# Patient Record
Sex: Male | Born: 1940 | Race: White | Hispanic: No | Marital: Married
Health system: Southern US, Community
[De-identification: ages and names within clinical notes are randomized; demographics above are authoritative.]

---

## 1998-01-01 ENCOUNTER — Encounter: Payer: Self-pay | Admitting: Emergency Medicine

## 1998-01-01 ENCOUNTER — Observation Stay (HOSPITAL_COMMUNITY): Admission: EM | Admit: 1998-01-01 | Discharge: 1998-01-02 | Payer: Self-pay | Admitting: Emergency Medicine

## 1998-01-02 ENCOUNTER — Ambulatory Visit (HOSPITAL_COMMUNITY): Admission: RE | Admit: 1998-01-02 | Discharge: 1998-01-02 | Payer: Self-pay | Admitting: Cardiovascular Disease

## 2003-02-17 ENCOUNTER — Other Ambulatory Visit: Payer: Self-pay

## 2003-02-18 ENCOUNTER — Other Ambulatory Visit: Payer: Self-pay

## 2004-06-09 ENCOUNTER — Emergency Department: Payer: Self-pay | Admitting: Emergency Medicine

## 2005-07-27 ENCOUNTER — Encounter: Payer: Self-pay | Admitting: Cardiovascular Disease

## 2006-01-26 ENCOUNTER — Inpatient Hospital Stay: Payer: Self-pay | Admitting: Internal Medicine

## 2006-01-26 ENCOUNTER — Other Ambulatory Visit: Payer: Self-pay

## 2006-01-27 ENCOUNTER — Other Ambulatory Visit: Payer: Self-pay

## 2006-01-28 ENCOUNTER — Other Ambulatory Visit: Payer: Self-pay

## 2006-01-29 ENCOUNTER — Other Ambulatory Visit: Payer: Self-pay

## 2006-02-05 ENCOUNTER — Inpatient Hospital Stay: Payer: Self-pay | Admitting: Internal Medicine

## 2006-02-05 ENCOUNTER — Other Ambulatory Visit: Payer: Self-pay

## 2012-03-06 ENCOUNTER — Ambulatory Visit: Payer: Self-pay | Admitting: Family Medicine

## 2012-03-28 ENCOUNTER — Ambulatory Visit: Payer: Self-pay | Admitting: Family Medicine

## 2012-04-28 ENCOUNTER — Ambulatory Visit: Payer: Self-pay | Admitting: Family Medicine

## 2012-06-21 ENCOUNTER — Telehealth: Payer: Self-pay | Admitting: *Deleted

## 2012-06-21 NOTE — Telephone Encounter (Signed)
Patient's wife was contacted today to verify there have been no medication changes since last office visit. Patient is still on the same medications with the addition of Spiriva. We will proceed with colonoscopy that is scheduled for 06-27-12. They will call if they have further questions.

## 2012-06-27 ENCOUNTER — Ambulatory Visit: Payer: Self-pay | Admitting: General Surgery

## 2012-06-27 DIAGNOSIS — K625 Hemorrhage of anus and rectum: Secondary | ICD-10-CM

## 2012-06-28 ENCOUNTER — Encounter: Payer: Self-pay | Admitting: General Surgery

## 2012-06-28 LAB — PATHOLOGY REPORT

## 2012-07-02 ENCOUNTER — Encounter: Payer: Self-pay | Admitting: General Surgery

## 2012-07-03 ENCOUNTER — Telehealth: Payer: Self-pay | Admitting: *Deleted

## 2012-07-03 NOTE — Progress Notes (Signed)
Quick Note:  Biopsy of the cecum showed Submucosal edema without evidence of colitis. The transverse colon "polyp" showed only lymphoid aggregate.  The patient will be notified of the results.   A follow up study would be recommended on clinical course.  Cc: Primary MD. ______

## 2012-07-03 NOTE — Telephone Encounter (Signed)
Pt pleased and agrees. Pt has appt with MD for Wednesday, would like to cancel unless the appointment if necessary.

## 2012-07-03 NOTE — Telephone Encounter (Signed)
Message copied by Currie Paris on Tue Jul 03, 2012  9:33 AM ------      Message from: Earline Mayotte      Created: Tue Jul 03, 2012  8:07 AM       Please notify the patient that the colonoscopy biopsies were fine. No follow up required unless he is having problems. Thanks. ------

## 2012-07-04 ENCOUNTER — Encounter: Payer: Self-pay | Admitting: General Surgery

## 2013-06-07 ENCOUNTER — Ambulatory Visit: Payer: Self-pay | Admitting: Family Medicine

## 2013-07-16 ENCOUNTER — Ambulatory Visit: Payer: Self-pay | Admitting: Family Medicine

## 2013-07-30 ENCOUNTER — Ambulatory Visit: Payer: Self-pay | Admitting: Family Medicine

## 2013-08-01 ENCOUNTER — Ambulatory Visit: Payer: Self-pay | Admitting: Internal Medicine

## 2013-08-05 LAB — CBC CANCER CENTER
Basophil #: 0.1 x10 3/mm (ref 0.0–0.1)
Basophil %: 1 %
Eosinophil #: 0.3 x10 3/mm (ref 0.0–0.7)
Eosinophil %: 2.7 %
HCT: 30 % — ABNORMAL LOW (ref 40.0–52.0)
HGB: 9.2 g/dL — ABNORMAL LOW (ref 13.0–18.0)
Lymphocyte #: 1.8 x10 3/mm (ref 1.0–3.6)
Lymphocyte %: 15.9 %
MCH: 22.3 pg — ABNORMAL LOW (ref 26.0–34.0)
MCHC: 30.5 g/dL — ABNORMAL LOW (ref 32.0–36.0)
MCV: 73 fL — ABNORMAL LOW (ref 80–100)
Monocyte #: 0.8 x10 3/mm (ref 0.2–1.0)
Monocyte %: 7 %
Neutrophil #: 8.4 x10 3/mm — ABNORMAL HIGH (ref 1.4–6.5)
Neutrophil %: 73.4 %
Platelet: 320 x10 3/mm (ref 150–440)
RBC: 4.11 10*6/uL — ABNORMAL LOW (ref 4.40–5.90)
RDW: 19.7 % — ABNORMAL HIGH (ref 11.5–14.5)
WBC: 11.4 x10 3/mm — ABNORMAL HIGH (ref 3.8–10.6)

## 2013-08-05 LAB — APTT: ACTIVATED PTT: 27.9 s (ref 23.6–35.9)

## 2013-08-05 LAB — BASIC METABOLIC PANEL
Anion Gap: 5 — ABNORMAL LOW (ref 7–16)
BUN: 8 mg/dL (ref 7–18)
CALCIUM: 9.4 mg/dL (ref 8.5–10.1)
CHLORIDE: 98 mmol/L (ref 98–107)
CREATININE: 0.91 mg/dL (ref 0.60–1.30)
Co2: 34 mmol/L — ABNORMAL HIGH (ref 21–32)
Glucose: 171 mg/dL — ABNORMAL HIGH (ref 65–99)
OSMOLALITY: 276 (ref 275–301)
Potassium: 2.8 mmol/L — ABNORMAL LOW (ref 3.5–5.1)
SODIUM: 137 mmol/L (ref 136–145)

## 2013-08-05 LAB — FERRITIN: FERRITIN (ARMC): 265 ng/mL (ref 8–388)

## 2013-08-05 LAB — PROTIME-INR
INR: 1.1
PROTHROMBIN TIME: 14.4 s (ref 11.5–14.7)

## 2013-08-05 LAB — RETICULOCYTES
Absolute Retic Count: 0.0365 10*6/uL (ref 0.019–0.186)
RETICULOCYTE: 0.89 % (ref 0.4–3.1)

## 2013-08-05 LAB — IRON AND TIBC
Iron Bind.Cap.(Total): 130 ug/dL — ABNORMAL LOW (ref 250–450)
Iron Saturation: 14 %
Iron: 18 ug/dL — ABNORMAL LOW (ref 65–175)
Unbound Iron-Bind.Cap.: 112 ug/dL

## 2013-08-05 LAB — FOLATE: FOLIC ACID: 15 ng/mL (ref 3.1–100.0)

## 2013-08-09 ENCOUNTER — Ambulatory Visit: Payer: Self-pay | Admitting: Internal Medicine

## 2013-08-11 ENCOUNTER — Emergency Department: Payer: Self-pay | Admitting: Emergency Medicine

## 2013-08-11 LAB — CBC WITH DIFFERENTIAL/PLATELET
BASOS PCT: 0.6 %
Basophil #: 0.1 10*3/uL (ref 0.0–0.1)
EOS PCT: 0.9 %
Eosinophil #: 0.1 10*3/uL (ref 0.0–0.7)
HCT: 28.4 % — AB (ref 40.0–52.0)
HGB: 8.8 g/dL — ABNORMAL LOW (ref 13.0–18.0)
Lymphocyte #: 1.1 10*3/uL (ref 1.0–3.6)
Lymphocyte %: 8.8 %
MCH: 23.1 pg — ABNORMAL LOW (ref 26.0–34.0)
MCHC: 31.1 g/dL — ABNORMAL LOW (ref 32.0–36.0)
MCV: 74 fL — AB (ref 80–100)
Monocyte #: 0.6 x10 3/mm (ref 0.2–1.0)
Monocyte %: 5.2 %
NEUTROS ABS: 10.2 10*3/uL — AB (ref 1.4–6.5)
NEUTROS PCT: 84.5 %
Platelet: 280 10*3/uL (ref 150–440)
RBC: 3.83 10*6/uL — AB (ref 4.40–5.90)
RDW: 20.1 % — ABNORMAL HIGH (ref 11.5–14.5)
WBC: 12.1 10*3/uL — ABNORMAL HIGH (ref 3.8–10.6)

## 2013-08-11 LAB — COMPREHENSIVE METABOLIC PANEL
ALK PHOS: 87 U/L
ANION GAP: 6 — AB (ref 7–16)
AST: 11 U/L — AB (ref 15–37)
Albumin: 2 g/dL — ABNORMAL LOW (ref 3.4–5.0)
BUN: 9 mg/dL (ref 7–18)
Bilirubin,Total: 0.4 mg/dL (ref 0.2–1.0)
CHLORIDE: 100 mmol/L (ref 98–107)
CO2: 33 mmol/L — AB (ref 21–32)
Calcium, Total: 9.6 mg/dL (ref 8.5–10.1)
Creatinine: 0.92 mg/dL (ref 0.60–1.30)
EGFR (African American): >60
EGFR (Non-African Amer.): >60
Glucose: 178 mg/dL — ABNORMAL HIGH (ref 65–99)
Osmolality: 281 (ref 275–301)
Potassium: 3.1 mmol/L — ABNORMAL LOW (ref 3.5–5.1)
SGPT (ALT): <6 U/L — ABNORMAL LOW (ref 12–78)
SODIUM: 139 mmol/L (ref 136–145)
Total Protein: 7.4 g/dL (ref 6.4–8.2)

## 2013-08-11 LAB — TROPONIN I: Troponin-I: <0.02 ng/mL

## 2013-08-13 LAB — PATHOLOGY REPORT

## 2013-08-26 ENCOUNTER — Ambulatory Visit: Payer: Self-pay | Admitting: Internal Medicine

## 2013-09-04 LAB — CBC CANCER CENTER
Basophil #: 0.2 x10 3/mm — ABNORMAL HIGH (ref 0.0–0.1)
Basophil %: 1.4 %
Eosinophil #: 0.4 x10 3/mm (ref 0.0–0.7)
Eosinophil %: 3.2 %
HCT: 27.4 % — AB (ref 40.0–52.0)
HGB: 8.4 g/dL — ABNORMAL LOW (ref 13.0–18.0)
LYMPHS PCT: 6.5 %
Lymphocyte #: 0.8 x10 3/mm — ABNORMAL LOW (ref 1.0–3.6)
MCH: 23.5 pg — AB (ref 26.0–34.0)
MCHC: 30.5 g/dL — ABNORMAL LOW (ref 32.0–36.0)
MCV: 77 fL — AB (ref 80–100)
Monocyte #: 0.6 x10 3/mm (ref 0.2–1.0)
Monocyte %: 4.7 %
NEUTROS PCT: 84.2 %
Neutrophil #: 9.9 x10 3/mm — ABNORMAL HIGH (ref 1.4–6.5)
Platelet: 301 x10 3/mm (ref 150–440)
RBC: 3.56 10*6/uL — ABNORMAL LOW (ref 4.40–5.90)
RDW: 19.9 % — ABNORMAL HIGH (ref 11.5–14.5)
WBC: 11.7 x10 3/mm — AB (ref 3.8–10.6)

## 2013-09-16 LAB — CBC CANCER CENTER
BASOS PCT: 1.1 %
Basophil #: 0.1 x10 3/mm (ref 0.0–0.1)
EOS ABS: 1.5 x10 3/mm — AB (ref 0.0–0.7)
Eosinophil %: 12 %
HCT: 26.8 % — ABNORMAL LOW (ref 40.0–52.0)
HGB: 8.2 g/dL — AB (ref 13.0–18.0)
Lymphocyte #: 0.9 x10 3/mm — ABNORMAL LOW (ref 1.0–3.6)
Lymphocyte %: 7.1 %
MCH: 23.9 pg — ABNORMAL LOW (ref 26.0–34.0)
MCHC: 30.5 g/dL — ABNORMAL LOW (ref 32.0–36.0)
MCV: 79 fL — AB (ref 80–100)
MONO ABS: 0.5 x10 3/mm (ref 0.2–1.0)
Monocyte %: 3.8 %
NEUTROS PCT: 76 %
Neutrophil #: 9.5 x10 3/mm — ABNORMAL HIGH (ref 1.4–6.5)
Platelet: 355 x10 3/mm (ref 150–440)
RBC: 3.41 10*6/uL — ABNORMAL LOW (ref 4.40–5.90)
RDW: 19.6 % — AB (ref 11.5–14.5)
WBC: 12.5 x10 3/mm — AB (ref 3.8–10.6)

## 2013-09-23 LAB — CBC CANCER CENTER
Basophil #: 0.1 x10 3/mm (ref 0.0–0.1)
Basophil %: 0.6 %
Eosinophil #: 1 x10 3/mm — ABNORMAL HIGH (ref 0.0–0.7)
Eosinophil %: 10.7 %
HCT: 25 % — ABNORMAL LOW (ref 40.0–52.0)
HGB: 7.7 g/dL — AB (ref 13.0–18.0)
Lymphocyte #: 0.5 x10 3/mm — ABNORMAL LOW (ref 1.0–3.6)
Lymphocyte %: 5.5 %
MCH: 24.1 pg — ABNORMAL LOW (ref 26.0–34.0)
MCHC: 30.9 g/dL — ABNORMAL LOW (ref 32.0–36.0)
MCV: 78 fL — AB (ref 80–100)
MONOS PCT: 5 %
Monocyte #: 0.5 x10 3/mm (ref 0.2–1.0)
NEUTROS PCT: 78.2 %
Neutrophil #: 7.5 x10 3/mm — ABNORMAL HIGH (ref 1.4–6.5)
PLATELETS: 304 x10 3/mm (ref 150–440)
RBC: 3.21 10*6/uL — AB (ref 4.40–5.90)
RDW: 19.2 % — AB (ref 11.5–14.5)
WBC: 9.6 x10 3/mm (ref 3.8–10.6)

## 2013-09-25 ENCOUNTER — Ambulatory Visit: Payer: Self-pay | Admitting: Internal Medicine

## 2013-10-26 ENCOUNTER — Ambulatory Visit: Payer: Self-pay | Admitting: Internal Medicine

## 2013-11-26 ENCOUNTER — Ambulatory Visit: Payer: Self-pay | Admitting: Internal Medicine

## 2013-12-19 LAB — BASIC METABOLIC PANEL WITH GFR
Anion Gap: 4 — ABNORMAL LOW
BUN: 9 mg/dL
Calcium, Total: 9.1 mg/dL
Chloride: 99 mmol/L
Co2: 34 mmol/L — ABNORMAL HIGH
Creatinine: 0.91 mg/dL
EGFR (African American): >60
EGFR (Non-African Amer.): >60
Glucose: 162 mg/dL — ABNORMAL HIGH
Osmolality: 276
Potassium: 3.5 mmol/L
Sodium: 137 mmol/L

## 2013-12-19 LAB — CBC CANCER CENTER
Basophil #: 0.1 10*3/uL
Basophil %: 0.7 %
Eosinophil #: 0.1 10*3/uL
Eosinophil %: 1.7 %
HCT: 30.5 % — ABNORMAL LOW
HGB: 9.3 g/dL — ABNORMAL LOW
Lymphocyte %: 5.5 %
Lymphs Abs: 0.4 10*3/uL — ABNORMAL LOW
MCH: 23.4 pg — ABNORMAL LOW
MCHC: 30.5 g/dL — ABNORMAL LOW
MCV: 77 fL — ABNORMAL LOW
Monocyte #: 0.4 10*3/uL
Monocyte %: 5.6 %
Neutrophil #: 6.8 10*3/uL — ABNORMAL HIGH
Neutrophil %: 86.5 %
Platelet: 429 10*3/uL
RBC: 3.97 10*6/uL — ABNORMAL LOW
RDW: 19.5 % — ABNORMAL HIGH
WBC: 7.8 10*3/uL

## 2013-12-19 LAB — HEPATIC FUNCTION PANEL A (ARMC)
Albumin: 1.6 g/dL — ABNORMAL LOW (ref 3.4–5.0)
Alkaline Phosphatase: 96 U/L
Bilirubin, Direct: 0.4 mg/dL — ABNORMAL HIGH (ref 0.00–0.20)
Bilirubin,Total: 0.6 mg/dL (ref 0.2–1.0)
SGOT(AST): 16 U/L (ref 15–37)
SGPT (ALT): 13 U/L — ABNORMAL LOW
Total Protein: 7.9 g/dL (ref 6.4–8.2)

## 2013-12-26 ENCOUNTER — Ambulatory Visit: Payer: Self-pay | Admitting: Internal Medicine

## 2014-01-26 ENCOUNTER — Ambulatory Visit: Admit: 2014-01-26 | Disposition: A | Discharge status: Home / Self Care | Payer: Self-pay | Admitting: Internal Medicine

## 2014-02-25 DEATH — deceased

## 2014-07-19 NOTE — Consult Note (Signed)
Reason for Visit: This 74 year old Male patient presents to the clinic for initial evaluation of  lung cancer .   Referred by Dr Sherrlyn Hock.  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 He may have increasing stomach pain and left chest pain with increasing shortness of breath productive cough with blood-tinged sputum. CT scan of the chest on 07/30/2013 showed a large 11 x 7 x 10 cm mass in the left upper lobe with invasion into the mediastinum with nodal metastasis in the prevascular space. Bone scan showed no evidence to suggest bone metastasis although invasion of ribs was probable secondary to slight increased activity in that area of the left chest. Head CT was negative. Patient's loss of 15-20 pounds over the past month. CT-guided biopsy was positive for squamous cell carcinoma moderately well differentiated. He is seen today in consultation regarding probable palliative radiation.  Past Hx:    HTN:    MI - Myocardial Infarct:    Pacemaker:    CABG (Coronary Artery Bypass Graft):   Past, Family and Social History:  Past Medical History positive   Cardiovascular CABG performed; coronary artery disease; hyperlipidemia; hypertension; myocardial infarction; pacemaker   Respiratory COPD; allergic rhinitisConnor, on home oxygen   Gastrointestinal diverticulitis   Neurological/Psychiatric anxiety; TIA;  Bell's palsy, frontotemporal dementia   Infections herpes zoster   Past Medical History Comments arthritis of the knee   Family History positive   Family History Comments family history of heart disease skin cancer and brain tumor   Social History positive   Social History Comments greater than 50-pack-year smoking history quit in April 2015, no EtOH abuse history   Additional Past Medical and Surgical History accompanied by son and wife today   Allergies:   No Known Allergies:   Home Meds:  Home Medications: Medication Instructions Status  senna 17.2 mg oral tablet 1 tab(s) orally 2 times a day Active  Colace sodium 100 mg oral capsule 1 cap(s) orally 2 times a day Active  enalapril 10 mg oral tablet 1 tab(s) orally once a day Active  aspirin 81 mg oral tablet 1 tab(s) orally once a day Active  Spiriva 18 mcg inhalation capsule 1 each inhaled once a day Active  Proventil HFA CFC free 90 mcg/inh inhalation aerosol 2 puff(s) inhaled 4 times a day, As Needed - for Shortness of Breath Active  ProAir HFA CFC free 90 mcg/inh inhalation aerosol 2 puff(s) inhaled 4 times a day Active  Aricept 5 mg oral tablet 1 tab(s) orally 2 times a day Active  OxyCONTIN 40 mg oral tablet, extended release 1 tab(s) orally every 12 hours Active  Morphine Sulfate20mg /ml  0.25cc solution orally every 4 hours, prn  Active  LORazepam 0.5 mg oral tablet 1 tab(s) orally 3 times a day Active  Vitamin C 500 mg oral tablet 1 tab(s) orally once a day Active  PriLOSEC 20 mg oral delayed release capsule 1 cap(s) orally once a day Active  Iron supplement 65mg  1 tab(s) orally once a day Active  metoprolol tartrate 50 mg oral tablet 1 tab(s) orally 2 times a day Active   Review of Systems:  General negative   Performance Status (ECOG) 1   Skin negative   Breast negative   Ophthalmologic negative   ENMT negative   Respiratory and Thorax see HPI   Cardiovascular see HPI   Gastrointestinal see HPI    Genitourinary negative   Musculoskeletal negative   Neurological see HPI   Psychiatric negative   Hematology/Lymphatics negative   Endocrine see HPI   Allergic/Immunologic negative   Review of Systems   review of systems obtained from nurses notes  Nursing Notes:  Nursing Vital Signs and Chemo Nursing Nursing Notes: *CC Vital Signs Flowsheet:   27-May-15 14:47  Temp Temperature 96.6  Pulse Pulse 90  Respirations Respirations 24  SBP SBP 121  DBP DBP 76  Current Weight (kg) (kg) 70.1   Physical Exam:  General/Skin/HEENT:  Skin normal   Eyes normal   ENMT normal   Head and Neck normal   Additional PE well-developed wheelchair-bound male in NAD. Looks chronically fatigued and anorexic. No cervical or supraclavicular adenopathy is appreciated. Lungs are clear to A&P cardiac examination shows regular rate and rhythm he has a pacemaker placed in his left anterior chest. Abdomen is benign. Motor sensory and DTR levels appear equal and symmetric in the upper and lower extremities.   Breasts/Resp/CV/GI/GU:  Respiratory and Thorax normal   Cardiovascular normal   Gastrointestinal normal   Genitourinary normal   MS/Neuro/Psych/Lymph:  Musculoskeletal normal   Neurological normal   Lymphatics normal   Other Results:  Radiology Results: LabUnknown:    21-Apr-15 09:10, CT Head Without Contrast  PACS Image     11-May-15 14:37, Bone Scan Whole Body (Part 2 of 2)  PACS Image   CT:    21-Apr-15 09:10, CT Head Without Contrast  CT Head Without Contrast   REASON FOR EXAM:    UNSPECIFIED TRANSIENT CEREBRAL ISCHEMIA  COMMENTS:       PROCEDURE: MCT - MCT HEAD WITHOUT CONTRAST  - Jul 16 2013  9:10AM     CLINICAL DATA:  Left facial droop    EXAM:  CT HEAD WITHOUT CONTRAST    TECHNIQUE:  Contiguous axial images were obtained from the base of the skull  through the vertex without intravenous contrast.    COMPARISON:  None.  FINDINGS:  Age-appropriate atrophy.   Negative for hydrocephalus.    Negative for acute infarct, hemorrhage, or mass. No edema or midline  shift.    No acute bony abnormality.  Atherosclerotic calcification.     IMPRESSION:  No acute abnormality.      Electronically Signed    By: Marlan Palauharles  Clark M.D.    On: 07/16/2013 09:18     Verified By: Camelia PhenesAVID C. CLARK, M.D.,  Nuclear Med:    11-May-15 14:37, Bone Scan Whole Body (Part 2 of 2)  Bone Scan Whole Body (Part 2 of 2)   REASON FOR EXAM:    Lung CA Back Pain Eval Bone Metastasis  COMMENTS:       PROCEDURE: NM  - NM BONE WB 3 HR 2 OF 2  - Aug 05 2013  2:37PM     CLINICAL DATA:  Known lung malignancy, back pain, suspect metastatic  disease    EXAM:  NUCLEAR MEDICINE WHOLE BODY BONE SCAN    TECHNIQUE:  Whole body anterior and posterior images were obtained  approximately  3 hours after intravenous injection of radiopharmaceutical.  RADIOPHARMACEUTICALS:  22.562 mCi Technetium-99 MDP    COMPARISON:  CT CHEST-ABD-PELV W/ CM dated 07/30/2013    FINDINGS:  There is adequate uptake of the radiopharmaceutical by the skeleton.  There is adequate soft tissue clearance and renal activity. There is  considerable urinary subtle rolling over the lower extremities.    There is patchy uptake of the radiopharmaceutical within the  calvarium in a pattern not highly suspicious for metastatic disease.  There is increased uptake involving the third and fourth and fifth  ribs posteriorly on the left. This corresponds to lytic fociin the  thirty-fourth ribs on the previous CT scan. No definite fifth rib  abnormality on the previous CT scan is demonstrated.  There is a small amount of increased uptake in the spinous processes  of the upper thoracic spine is likely degenerative.Activity  elsewhere within the cervical and thoracic and lumbar spine is  within the limits of normal with no findings suspicious for  metastatic disease. No suspicious abnormal rib uptake is deep  is  demonstrated. Subtle increased uptake in the superior aspect of the  left SI joint is likely degenerative in nature. There is no  suspicious finding demonstrated here on the previous CT images.  Activity over the bony pelvis appears normal. No definite  abnormalities within the shafts of the femurs nor tibias nor fibulas  is demonstrated. There is activity at projects over the toes on the  left which may reflect urinary swollen or could be degenerative in  nature.     IMPRESSION:  1. No significant abnormal uptake within the spine or pelvis is  demonstrated.  2. Minimally increased uptake in the third and fourth and fifth ribs  posteriorly on the left is present. The third and fourth rib lesions  correspond to lytic areas noted on the previous CT scan. The  activity in the fifth rib does notcorrespond to any plain film  abnormality. These findings are adjacent to the known left upper  lobe mass.  3. No suspicious abnormality within the calvarium, upper  extremities, or lower extremities is demonstrated.      Electronically Signed    [id number]  [place]    On: 08/05/2013 15:35     Verified By: DAVID A. [place], M.D., MD   Relevent Results:   Relevant Scans and Labs CT scans of head chest are reviewed as well as bone scan   Assessment and Plan: Impression:   stage IIIB squamous cell carcinoma left lung in 74 year old male with multiple comorbidities for palliative radiation Plan:   at this time I have recommended palliative radiation therapy up to 4000 cGy over 4 weeks to his large left upper lobe lung cancer. I believe I may be of to reinflate the lung, prevent further atelectasis and hemoptysis. Would reevaluate after 2 week break for possible small field boost depending on patient's condition and overall response to treatment. I have discussed the case personally with medical oncology. Based on his multiple comorbidities they're declining systemic concurrent chemotherapy.  Patient is and family are in agreement and treatment. Risks and benefits of treatment including possible dysphasia, skin reaction, alteration of blood counts, and injury to normal lung all were discussed in detail with the patient and his family. I have set him up for emergent CT simulation later this week.  I would like to take this opportunity for allowing me to participate in the care of your patient..  CC Referral:  cc: Dr. Len Blalock   Electronic Signatures: Rushie Chestnut, Gordy Councilman (MD)  (Signed 27-May-15 15:30)  Authored: HPI, Diagnosis, Past Hx, PFSH, Allergies, Home Meds, ROS, Nursing Notes, Physical Exam, Other Results, Relevent Results, Encounter Assessment and Plan, CC Referring Physician   Last Updated: 27-May-15 15:30 by Rebeca Alert (MD)

## 2015-03-01 IMAGING — CT CT ASPIRATION
4 series · 16 of 32 positions shown, 19 images · non-contrast
Comparison: none

CLINICAL DATA: Lung mass with local extension to involve the left
fifth rib.

EXAM:
CT GUIDED LEFT RIB CORE BIOPSY
TECHNIQUE: The procedure, risks (including but not limited to bleeding,
infection, organ damage ), benefits, and alternatives were explained
to the patient AND FAMILY. Questions regarding the procedure were
encouraged and answered. The patient and family understand and
consent to the procedure. Patient was placed supine on the CT gantry
and limited axial scans through the thorax were obtained. Lesion was
localized. Appropriate skin entry site was identified. Skin site was
marked, prepped with chlorhexidine, draped in usual sterile fashion,
and infiltrated locally with 1% lidocaine. Under CT fluoroscopic
guidance 66gauge trocar needle was advanced to the margin of the
left fifth rib lytic lesion. Once needle tip position was confirmed,
coaxial 18 gauge core samples were obtained. CT needle removed. Post
procedure scans show no hematoma, pneumothorax, or fracture. Patient
tolerated procedure well, with no immediate complication.

[Series 2: routine chest wo · axial · 0.71mm/px · z∈[+1152,+1367]mm · 8 of 54 slices shown (1 of 3)]
[im 7/54  soft-tissue]
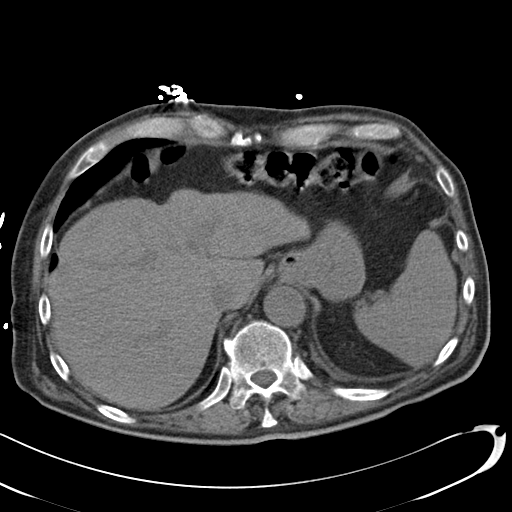
[im 13/54  soft-tissue]
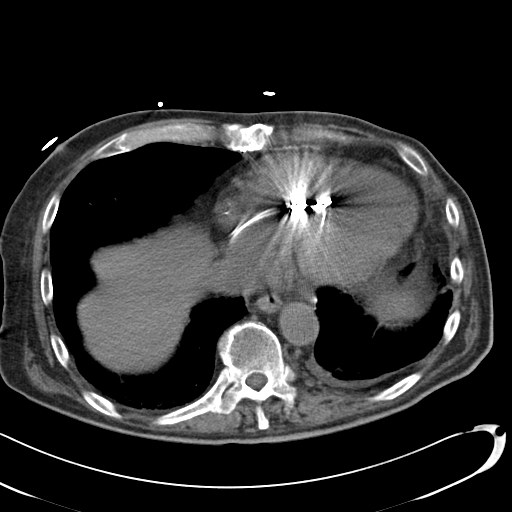
[im 19/54  soft-tissue]
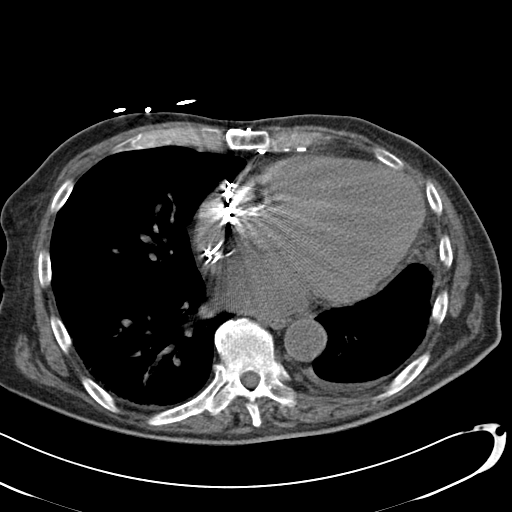
[im 25/54  soft-tissue]
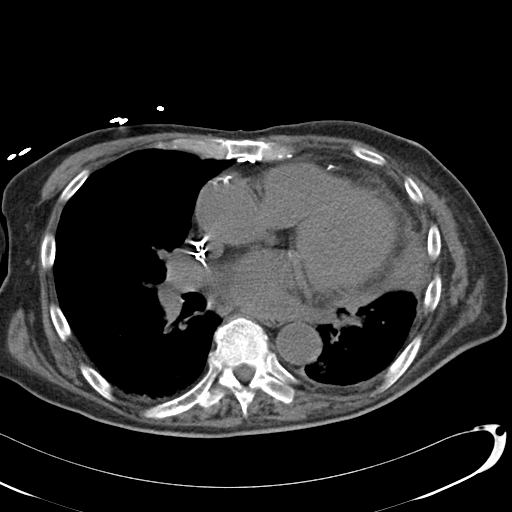
[im 32/54  soft-tissue]
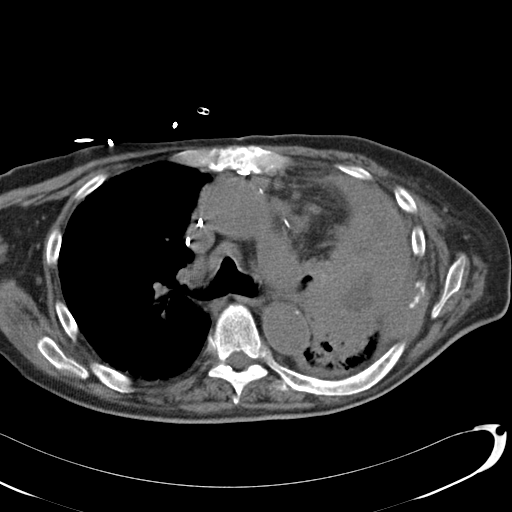
[im 38/54  soft-tissue]
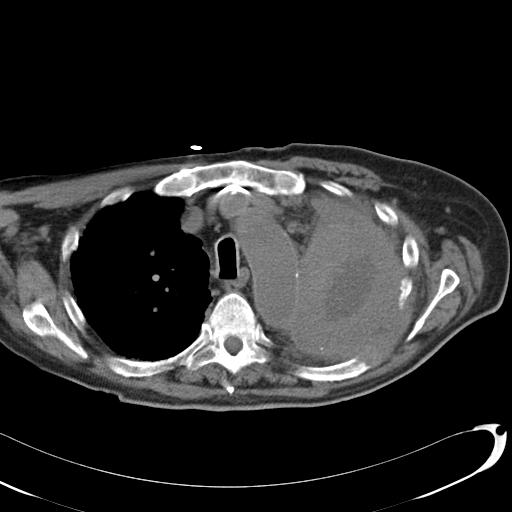
[im 44/54  soft-tissue]
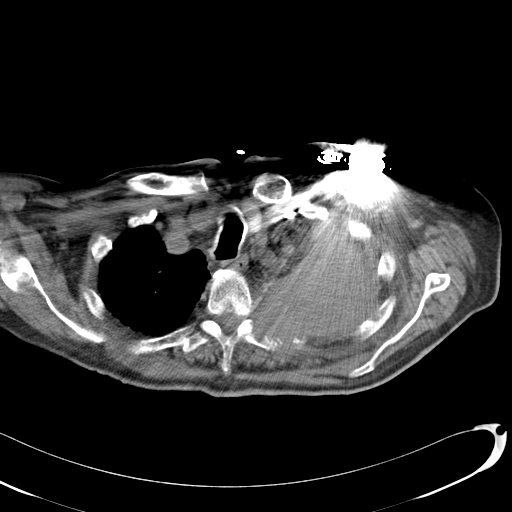
[im 50/54  soft-tissue]
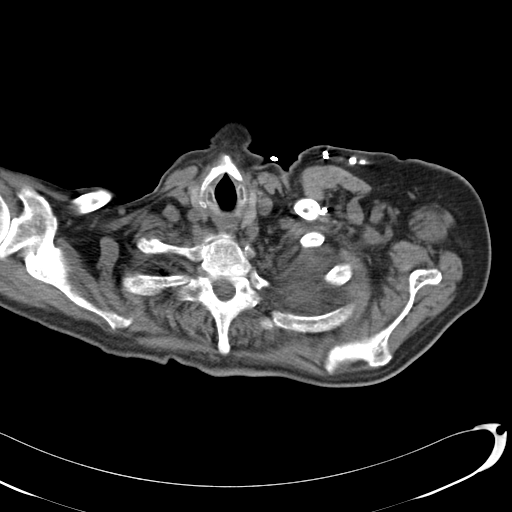

[Series 4: routine chest wo · axial · 0.71mm/px · z∈[+1268,+1308]mm · 3 of 16 slices shown (2 of 3)]
[im 4/16  soft-tissue]
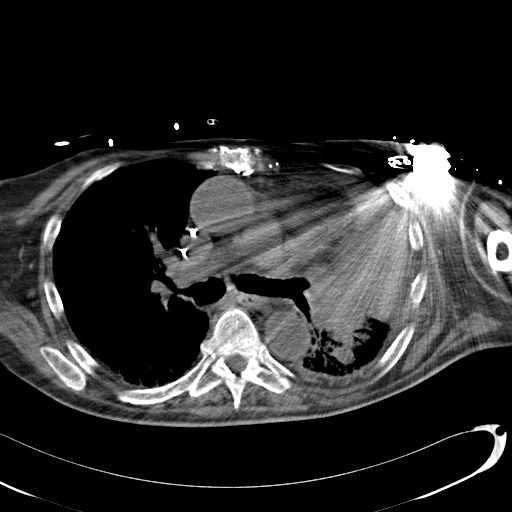
[im 8/16  soft-tissue]
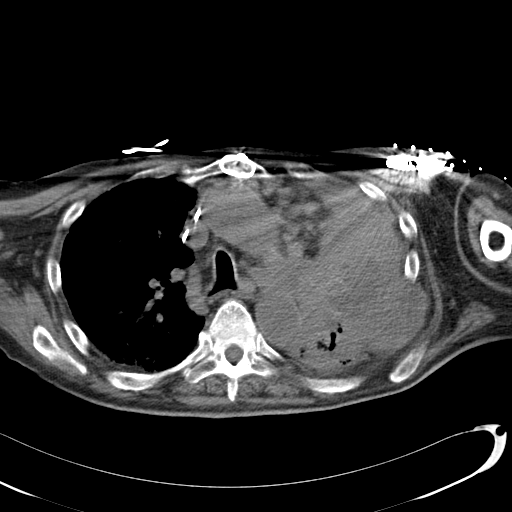
[im 12/16  soft-tissue]
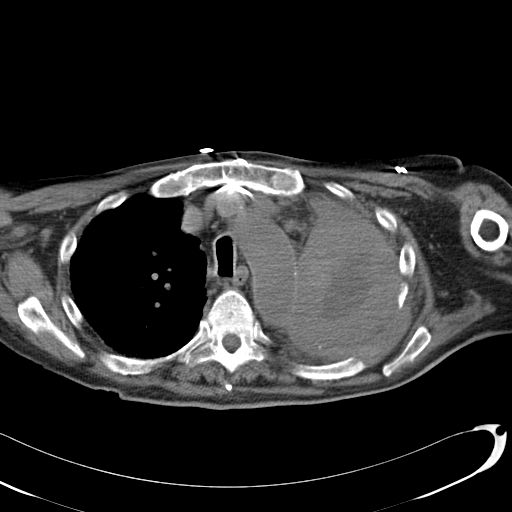

[Series 7: (hospital) 2.4 b30s · axial · 0.61mm/px · z∈[+1286,+1286]mm · 2 of 14 slices shown, 5 images]
[im 5/14  soft-tissue]
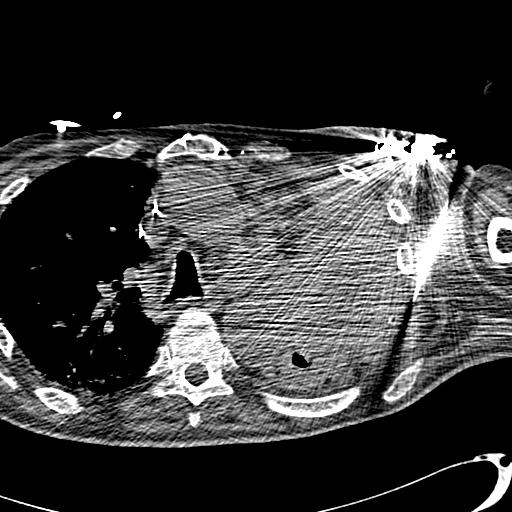
[im 5/14  lung]
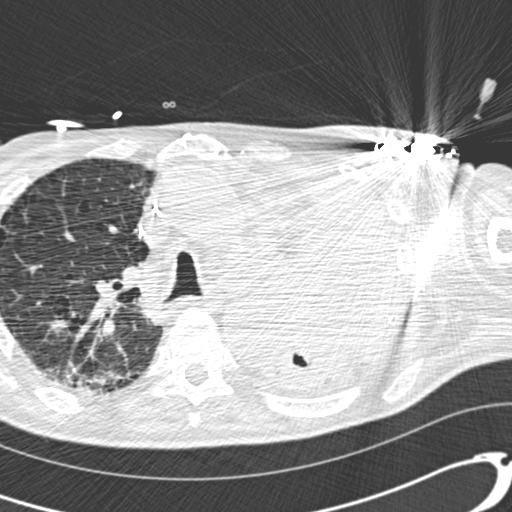
[im 5/14  bone]
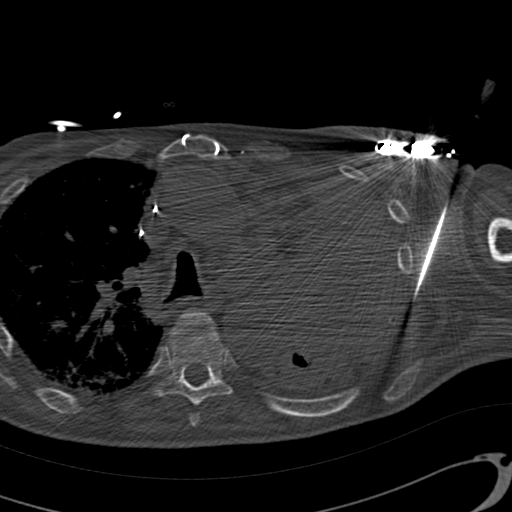
[im 9/14  soft-tissue]
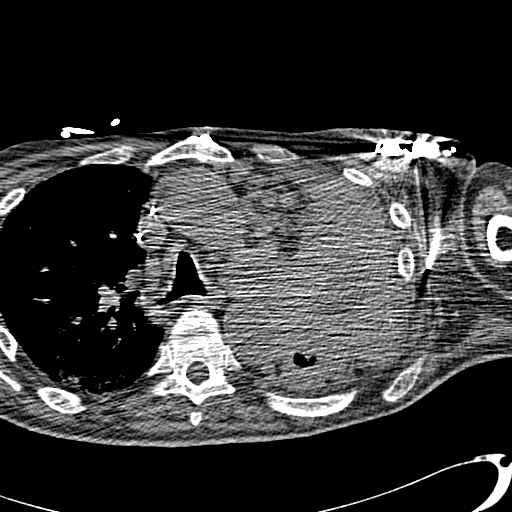
[im 9/14  lung]
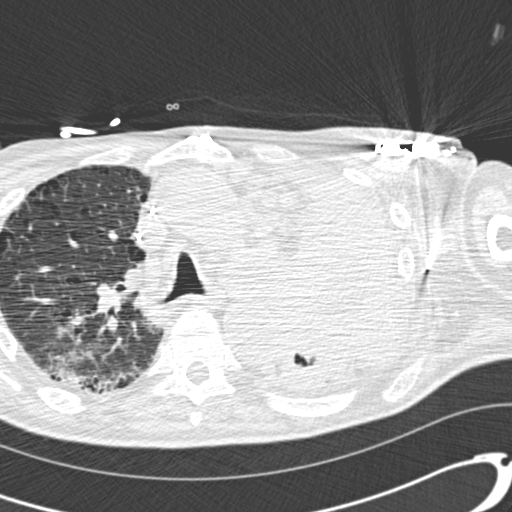

[Series 8: routine chest wo · axial · 0.71mm/px · z∈[+1268,+1308]mm · 3 of 16 slices shown (3 of 3)]
[im 4/16  soft-tissue]
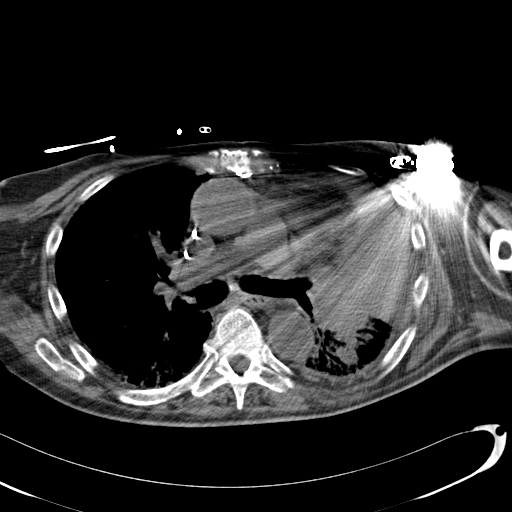
[im 8/16  soft-tissue]
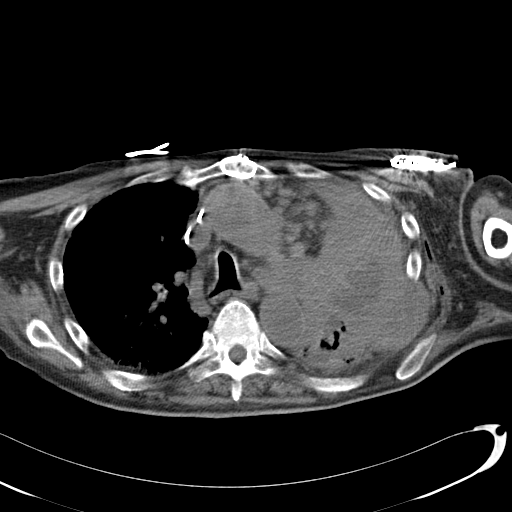
[im 12/16  soft-tissue]
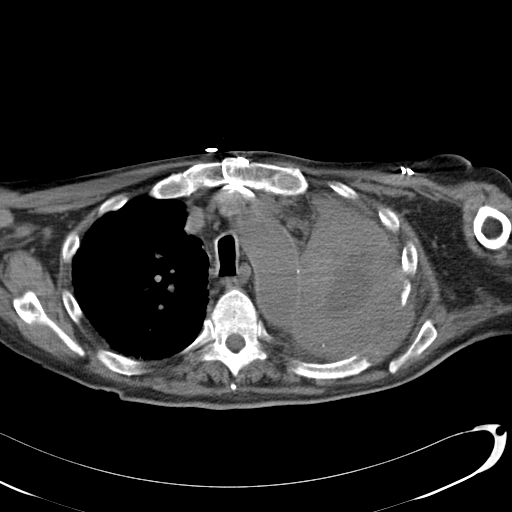

[16 of 32 positions shown; findings below may reference images not displayed]

IMPRESSION: 1. Technically successful CT guided left rib lesion  core  biopsy.
